# Patient Record
Sex: Female | Born: 2000 | Hispanic: No | Marital: Single | Smoking: Never smoker
Health system: Southern US, Community
[De-identification: ages and names within clinical notes are randomized; demographics above are authoritative.]

## PROBLEM LIST (undated history)

## (undated) DIAGNOSIS — Z789 Other specified health status: Secondary | ICD-10-CM

## (undated) HISTORY — DX: Other specified health status: Z78.9

---
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
## 2019-01-06 ENCOUNTER — Ambulatory Visit: Payer: Self-pay | Admitting: *Deleted

## 2019-01-06 NOTE — Telephone Encounter (Signed)
Pt reports cough, productive for minimal amounts of clear phlegm. Also reports mild headache. Onset last Tuesday, 12/29/2018. Pt states initially sneezing, runny nose but has subsided. Pt concerned because she had been in [facility], returned yesterday; states symptoms had started prior to trip. States afebrile; denies any wheezing, SOB, no chest tightness. States is taking Nyquil, effective. Home Care advise given. Pt verbalizes understanding. Directed to [employer] for additional information, further concerns.   Reason for Disposition . Cough  Answer Assessment - Initial Assessment Questions 1. ONSET: "When did the cough begin?"      Last Tuesday 2. SEVERITY: "How bad is the cough today?"      mild 3. RESPIRATORY DISTRESS: "Describe your breathing."      No SOB 4. FEVER: "Do you have a fever?" If so, ask: "What is your temperature, how was it measured, and when did it start?"     no 5. SPUTUM: "Describe the color of your sputum" (clear, white, yellow, green)     Clear 6. HEMOPTYSIS: "Are you coughing up any blood?" If so ask: "How much?" (flecks, streaks, tablespoons, etc.)     no 7. CARDIAC HISTORY: "Do you have any history of heart disease?" (e.g., heart attack, congestive heart failure)      no 8. LUNG HISTORY: "Do you have any history of lung disease?"  (e.g., pulmonary embolus, asthma, emphysema)     no 9. PE RISK FACTORS: "Do you have a history of blood clots?" (or: recent major surgery, recent prolonged travel, bedridden)     no 10. OTHER SYMPTOMS: "Do you have any other symptoms?" (e.g., runny nose, wheezing, chest pain)       no 11. PREGNANCY: "Is there any chance you are pregnant?" "When was your last menstrual period?"       no 12. TRAVEL: "Have you traveled out of the country in the last month?" (e.g., travel history, exposures)       no  Protocols used: COUGH - ACUTE PRODUCTIVE-A-AH

## 2019-08-04 ENCOUNTER — Encounter: Payer: Self-pay | Admitting: *Deleted

## 2019-08-25 ENCOUNTER — Other Ambulatory Visit: Payer: Self-pay | Admitting: Family Medicine

## 2019-08-25 DIAGNOSIS — N63 Unspecified lump in unspecified breast: Secondary | ICD-10-CM

## 2019-08-31 ENCOUNTER — Other Ambulatory Visit: Payer: Self-pay | Admitting: Emergency Medicine

## 2019-08-31 ENCOUNTER — Telehealth: Payer: Self-pay | Admitting: *Deleted

## 2019-08-31 DIAGNOSIS — N63 Unspecified lump in unspecified breast: Secondary | ICD-10-CM

## 2019-08-31 NOTE — Telephone Encounter (Signed)
Faxed signed URGENT request for mammogram from [facility]. Confirmation page 4:36 pm.

## 2019-09-02 ENCOUNTER — Ambulatory Visit
Admission: RE | Admit: 2019-09-02 | Discharge: 2019-09-02 | Disposition: A | Discharge status: Home / Self Care | Payer: 59 | Source: Ambulatory Visit | Attending: Family Medicine | Admitting: Family Medicine

## 2019-09-02 ENCOUNTER — Other Ambulatory Visit: Payer: Self-pay

## 2019-09-02 ENCOUNTER — Other Ambulatory Visit: Payer: Self-pay | Admitting: Emergency Medicine

## 2019-09-02 DIAGNOSIS — N63 Unspecified lump in unspecified breast: Secondary | ICD-10-CM

## 2019-11-02 ENCOUNTER — Ambulatory Visit: Payer: Self-pay | Attending: Internal Medicine

## 2019-11-11 DIAGNOSIS — Z20822 Contact with and (suspected) exposure to covid-19: Secondary | ICD-10-CM | POA: Diagnosis not present

## 2019-11-27 DIAGNOSIS — Z20828 Contact with and (suspected) exposure to other viral communicable diseases: Secondary | ICD-10-CM | POA: Diagnosis not present

## 2019-11-27 DIAGNOSIS — R05 Cough: Secondary | ICD-10-CM | POA: Diagnosis not present

## 2019-11-27 DIAGNOSIS — R0989 Other specified symptoms and signs involving the circulatory and respiratory systems: Secondary | ICD-10-CM | POA: Diagnosis not present

## 2019-12-07 DIAGNOSIS — Z20822 Contact with and (suspected) exposure to covid-19: Secondary | ICD-10-CM | POA: Diagnosis not present

## 2019-12-07 DIAGNOSIS — R0981 Nasal congestion: Secondary | ICD-10-CM | POA: Diagnosis not present

## 2019-12-20 DIAGNOSIS — Z20828 Contact with and (suspected) exposure to other viral communicable diseases: Secondary | ICD-10-CM | POA: Diagnosis not present

## 2019-12-26 DIAGNOSIS — Z20822 Contact with and (suspected) exposure to covid-19: Secondary | ICD-10-CM | POA: Diagnosis not present

## 2019-12-26 DIAGNOSIS — R509 Fever, unspecified: Secondary | ICD-10-CM | POA: Diagnosis not present

## 2019-12-26 DIAGNOSIS — Z20828 Contact with and (suspected) exposure to other viral communicable diseases: Secondary | ICD-10-CM | POA: Diagnosis not present

## 2020-01-02 DIAGNOSIS — Z20828 Contact with and (suspected) exposure to other viral communicable diseases: Secondary | ICD-10-CM | POA: Diagnosis not present

## 2020-01-12 DIAGNOSIS — Z20828 Contact with and (suspected) exposure to other viral communicable diseases: Secondary | ICD-10-CM | POA: Diagnosis not present

## 2020-01-17 DIAGNOSIS — Z20828 Contact with and (suspected) exposure to other viral communicable diseases: Secondary | ICD-10-CM | POA: Diagnosis not present

## 2020-01-18 DIAGNOSIS — H1045 Other chronic allergic conjunctivitis: Secondary | ICD-10-CM | POA: Diagnosis not present

## 2020-03-02 ENCOUNTER — Other Ambulatory Visit: Payer: 59

## 2020-03-10 ENCOUNTER — Inpatient Hospital Stay: Admission: RE | Admit: 2020-03-10 | Payer: 59 | Source: Ambulatory Visit

## 2020-04-08 DIAGNOSIS — Z20822 Contact with and (suspected) exposure to covid-19: Secondary | ICD-10-CM | POA: Diagnosis not present

## 2020-09-13 ENCOUNTER — Ambulatory Visit: Payer: 59 | Admitting: Registered Nurse

## 2020-09-13 DIAGNOSIS — Z23 Encounter for immunization: Secondary | ICD-10-CM | POA: Diagnosis not present

## 2020-09-13 DIAGNOSIS — E559 Vitamin D deficiency, unspecified: Secondary | ICD-10-CM | POA: Diagnosis not present

## 2020-09-13 DIAGNOSIS — Z0000 Encounter for general adult medical examination without abnormal findings: Secondary | ICD-10-CM | POA: Diagnosis not present

## 2020-09-13 DIAGNOSIS — L309 Dermatitis, unspecified: Secondary | ICD-10-CM | POA: Diagnosis not present

## 2020-09-13 DIAGNOSIS — Z13220 Encounter for screening for lipoid disorders: Secondary | ICD-10-CM | POA: Diagnosis not present

## 2021-04-09 ENCOUNTER — Ambulatory Visit: Payer: Self-pay | Admitting: Physician Assistant

## 2021-04-18 ENCOUNTER — Ambulatory Visit: Payer: Self-pay | Admitting: Physician Assistant

## 2021-06-05 ENCOUNTER — Other Ambulatory Visit: Payer: Self-pay | Admitting: Internal Medicine

## 2021-06-05 ENCOUNTER — Ambulatory Visit
Admission: RE | Admit: 2021-06-05 | Discharge: 2021-06-05 | Disposition: A | Discharge status: Home / Self Care | Payer: BC Managed Care – PPO | Source: Ambulatory Visit | Attending: Internal Medicine | Admitting: Internal Medicine

## 2021-06-05 ENCOUNTER — Other Ambulatory Visit: Payer: Self-pay

## 2021-06-05 DIAGNOSIS — M25572 Pain in left ankle and joints of left foot: Secondary | ICD-10-CM

## 2021-06-05 DIAGNOSIS — M79606 Pain in leg, unspecified: Secondary | ICD-10-CM | POA: Diagnosis not present

## 2021-06-05 DIAGNOSIS — M25472 Effusion, left ankle: Secondary | ICD-10-CM | POA: Diagnosis not present

## 2021-06-07 ENCOUNTER — Other Ambulatory Visit: Payer: Self-pay | Admitting: Internal Medicine

## 2021-06-07 ENCOUNTER — Other Ambulatory Visit: Payer: Self-pay

## 2021-06-07 ENCOUNTER — Ambulatory Visit
Admission: RE | Admit: 2021-06-07 | Discharge: 2021-06-07 | Disposition: A | Discharge status: Home / Self Care | Payer: BC Managed Care – PPO | Source: Ambulatory Visit | Attending: Internal Medicine | Admitting: Internal Medicine

## 2021-06-07 DIAGNOSIS — M79606 Pain in leg, unspecified: Secondary | ICD-10-CM

## 2021-06-07 DIAGNOSIS — R600 Localized edema: Secondary | ICD-10-CM | POA: Diagnosis not present

## 2021-06-07 DIAGNOSIS — M79605 Pain in left leg: Secondary | ICD-10-CM | POA: Diagnosis not present

## 2021-07-19 ENCOUNTER — Ambulatory Visit: Payer: Self-pay | Admitting: Physician Assistant

## 2022-01-26 ENCOUNTER — Emergency Department (HOSPITAL_COMMUNITY)
Admission: EM | Admit: 2022-01-26 | Discharge: 2022-01-26 | Disposition: A | Discharge status: Home / Self Care | Payer: 59 | Attending: Emergency Medicine | Admitting: Emergency Medicine

## 2022-01-26 ENCOUNTER — Other Ambulatory Visit: Payer: Self-pay

## 2022-01-26 ENCOUNTER — Encounter (HOSPITAL_COMMUNITY): Payer: Self-pay | Admitting: Emergency Medicine

## 2022-01-26 DIAGNOSIS — H5789 Other specified disorders of eye and adnexa: Secondary | ICD-10-CM | POA: Diagnosis present

## 2022-01-26 DIAGNOSIS — H1013 Acute atopic conjunctivitis, bilateral: Secondary | ICD-10-CM

## 2022-01-26 DIAGNOSIS — H1033 Unspecified acute conjunctivitis, bilateral: Secondary | ICD-10-CM | POA: Diagnosis not present

## 2022-01-26 MED ORDER — TRIAMCINOLONE ACETONIDE 0.1 % EX CREA: 1.0000 "application " | TOPICAL_CREAM | Freq: Two times a day (BID) | CUTANEOUS | 0 refills | Status: AC

## 2022-01-26 MED ORDER — TRIAMCINOLONE ACETONIDE 0.1 % EX CREA: TOPICAL_CREAM | Freq: Three times a day (TID) | CUTANEOUS | Status: DC | PRN

## 2022-01-26 NOTE — Discharge Instructions (Addendum)
There is information on allergic conjunctivitis attached to these papers.  I have also refilled your cream however I do not believe the symptoms you have right now are due to cellulitis. ? ?Start a medication such as Zyrtec, Allegra or Claritin and take this every day.  You hopefully we will see an improvement in any allergic symptoms.  Continue to use eyedrops and in the event that you lose vision, cannot move your eyes or have any worsening symptoms, return to the emergency department. ? ?You may get in contact and call your [facility] primary care office if needed for nonemergent checkup. ?

## 2022-01-26 NOTE — ED Provider Notes (Signed)
?[facility] EMERGENCY DEPARTMENT ?Provider Note ? ? ?CSN: [id number] ?Arrival date & time: 01/26/22  1106 ? ?  ? ?History ? ?Chief Complaint  ?Patient presents with  ? Burning Eyes  ? ? ?Becky Patel is a 21 y.o. female presenting today with bilateral eye irritation.  Says that this began yesterday as watering and burning in her eyes.  Said that last night there was also some watery discharge.  This morning she had similar symptoms and felt as though her vision was somewhat decreased.  Reports a recent URI, however other symptoms stopped last week.  Says that she usually does not have problems with pollen or seasonal allergies.  Reports a history of cellulitis around her eyes for which she uses triamcinolone cream provided by her PCP.  She reports that she has been using over-the-counter eyedrops which have been helping her eyes feel better.  Denies new face washes, make-up, recent eyelash installation, or other changes. ? ?HPI ? ?  ? ?Home Medications ?Prior to Admission medications   ?Medication Sig Start Date End Date Taking? Authorizing Provider  ?triamcinolone cream (KENALOG) 0.1 % Apply 1 application. topically 2 (two) times daily. 01/26/22  Yes Dariana Garbett A, PA-C  ?nystatin (MYCOSTATIN/NYSTOP) powder Apply topically 4 (four) times daily. ?Patient not taking: Reported on 11/13/2018 11/25/17   Sherren Mocha, MD  ?   ? ?Allergies    ?Patient has no known allergies.   ? ?Review of Systems   ?Review of Systems ?See HPI ?Physical Exam ?Updated Vital Signs ?BP 114/66   Pulse 75   Temp 98.6 ?F (37 ?C)   Resp 18   SpO2 100%  ?Physical Exam ?Vitals and nursing note reviewed.  ?Constitutional:   ?   Appearance: Normal appearance.  ?HENT:  ?   Head: Normocephalic and atraumatic.  ?Eyes:  ?   General: No scleral icterus. ?   Extraocular Movements: Extraocular movements intact.  ?   Pupils: Pupils are equal, round, and reactive to light.  ?   Comments: Bilateral watery eyes, erythematous eyelids.  No  discharge, crusting.  Some cobblestoning in the anterior upper eyelids bilaterally.  No proptosis, pain with EOMs, blepharitis or obvious cellulitis  ?Pulmonary:  ?   Effort: Pulmonary effort is normal. No respiratory distress.  ?Skin: ?   Findings: No rash.  ?Neurological:  ?   Mental Status: She is alert.  ?Psychiatric:     ?   Mood and Affect: Mood normal.  ? ? ?ED Results / Procedures / Treatments   ?Labs ?(all labs ordered are listed, but only abnormal results are displayed) ?Labs Reviewed - No data to display ? ?EKG ?None ? ?Radiology ?No results found. ? ?Procedures ?Procedures  ? ?Medications Ordered in ED ?Medications - No data to display ? ?ED Course/ Medical Decision Making/ A&P ?  ?                        ?Medical Decision Making ?Risk ?Prescription drug management. ? ? ?21 year old female presented with burning to her eyes.  Differential includes but is not limited to allergic conjunctivitis, viral conjunctivitis, bacterial conjunctivitis, orbital or preseptal cellulitis. ? ?Physical exam consistent with an allergic conjunctivitis.  Potentially has some viral component in the context of her recent URI.  Regardless, no antibiotics are indicated at this time.  She will use over-the-counter drops that have already been helping her and start taking an antihistamine.  Return precautions were discussed.  She  will follow-up with her primary care provider for any further concerns.  She is requesting a refill of her triamcinolone cream that she uses on her face because she accidentally left it somewhere out of town.  This has been refilled and she has been instructed to follow-up with [facility] PCP. ? ? ?Final Clinical Impression(s) / ED Diagnoses ?Final diagnoses:  ?Allergic conjunctivitis of both eyes  ? ? ?Rx / DC Orders ?ED Discharge Orders   ? ?      Ordered  ?  triamcinolone cream (KENALOG) 0.1 %  2 times daily       ? 01/26/22 1121  ? ?  ?  ? ?  ? ? ?  ?Saddie Benders, PA-C ?01/26/22 1131 ? ?  ?Gerhard Munch, MD ?01/26/22 1345 ? ?

## 2022-01-26 NOTE — ED Triage Notes (Signed)
C/o bilateral eyes red and burning since yesterday.   ?

## 2022-11-28 IMAGING — US US EXTREM LOW VENOUS*L*
1 series · 13 of 24 positions shown · non-contrast
Comparison: None.

CLINICAL DATA: Left lower extremity pain and edema for 2 weeks.



[Series 1: us extrem low venous*left* · 0.08mm/px · 13 of 42 slices shown]
[im 1/42]
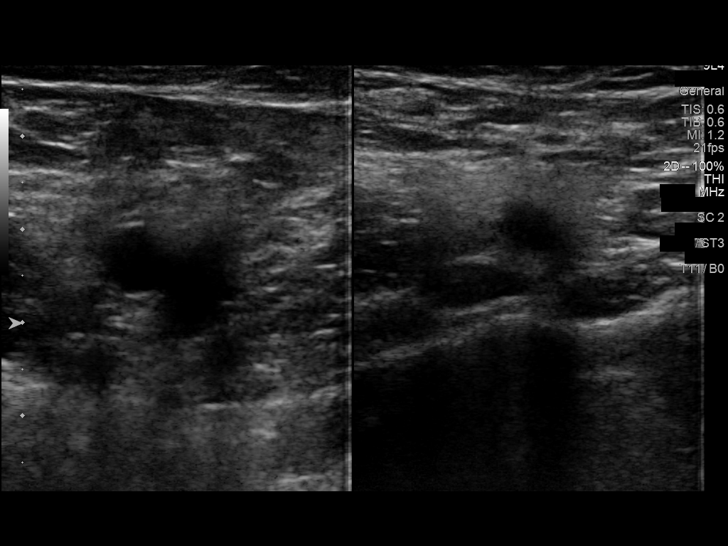
[im 4/42]
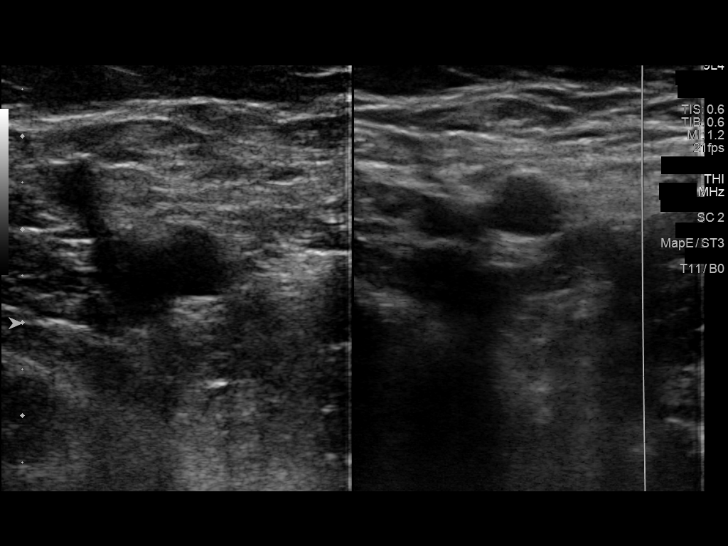
[im 8/42]
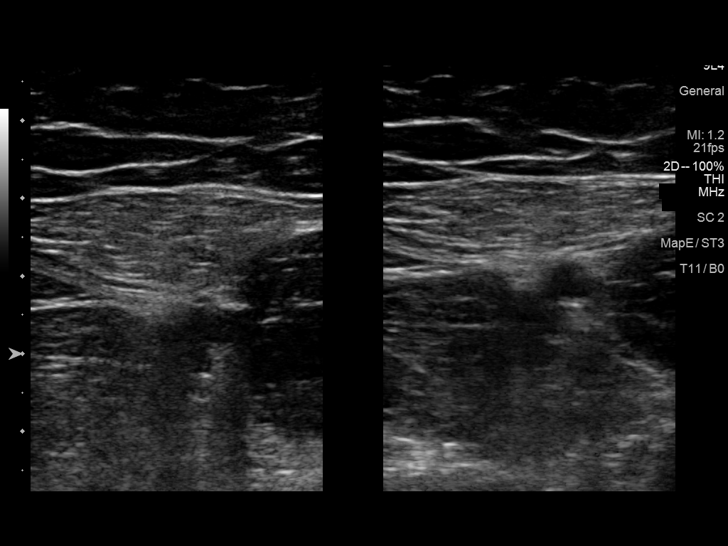
[im 11/42]
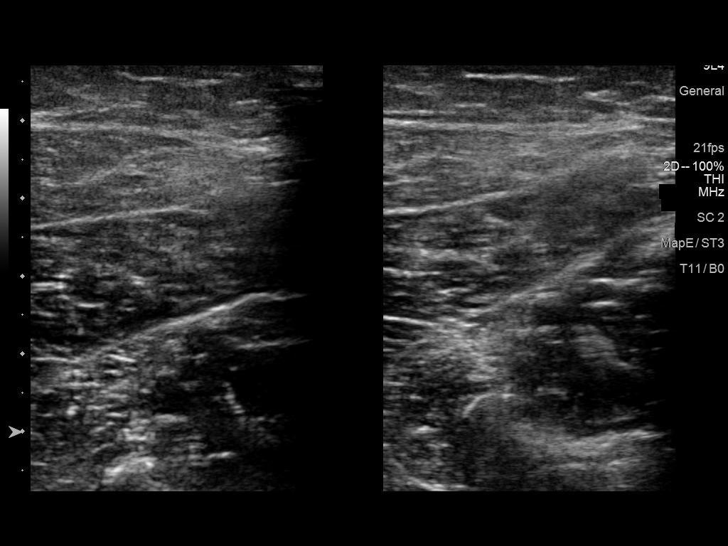
[im 15/42]
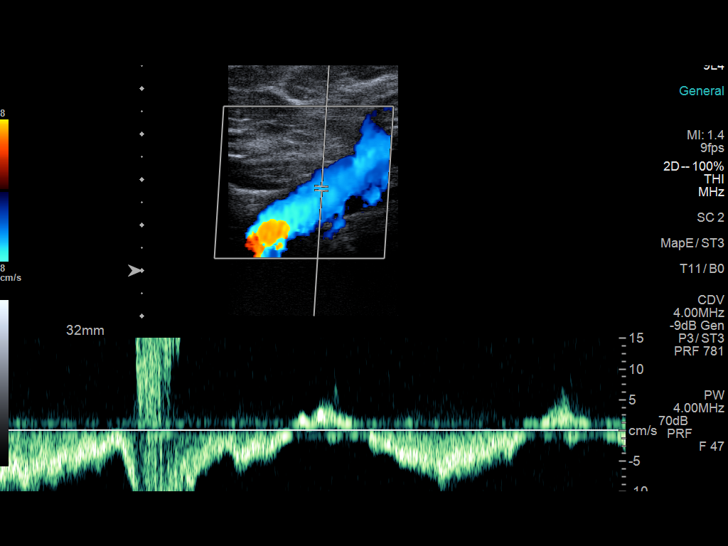
[im 18/42]
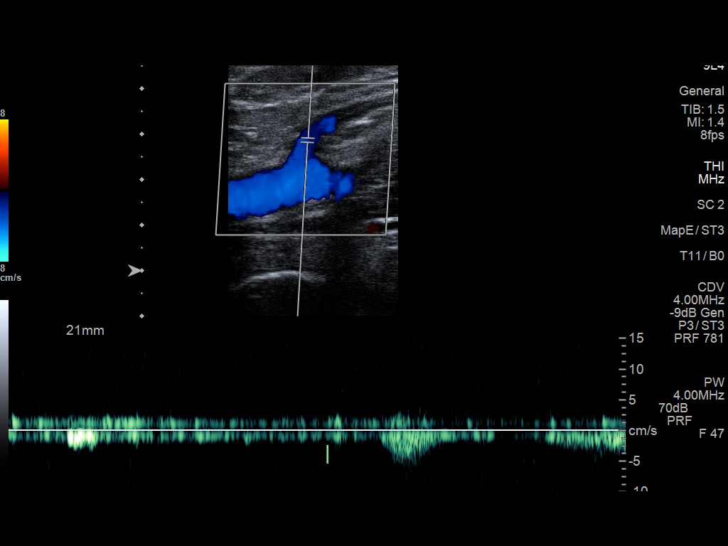
[im 22/42]
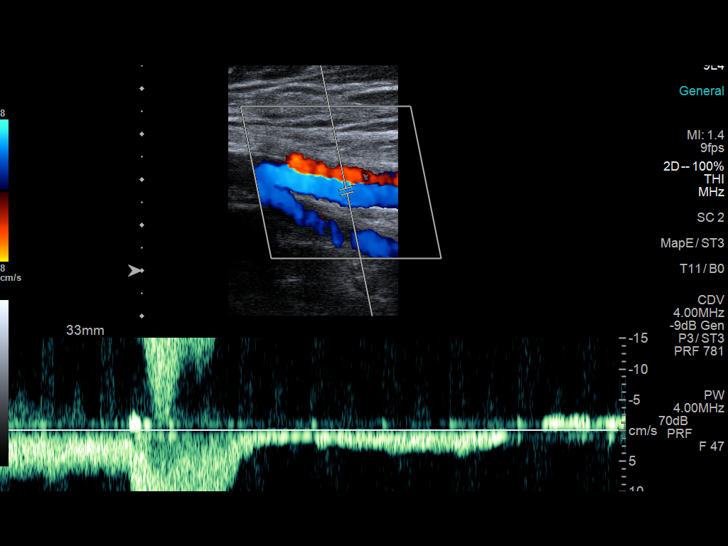
[im 24/42]
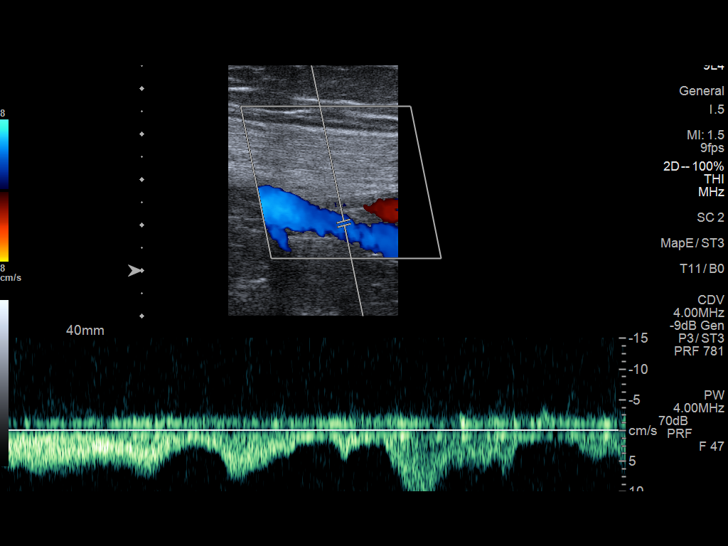
[im 27/42]
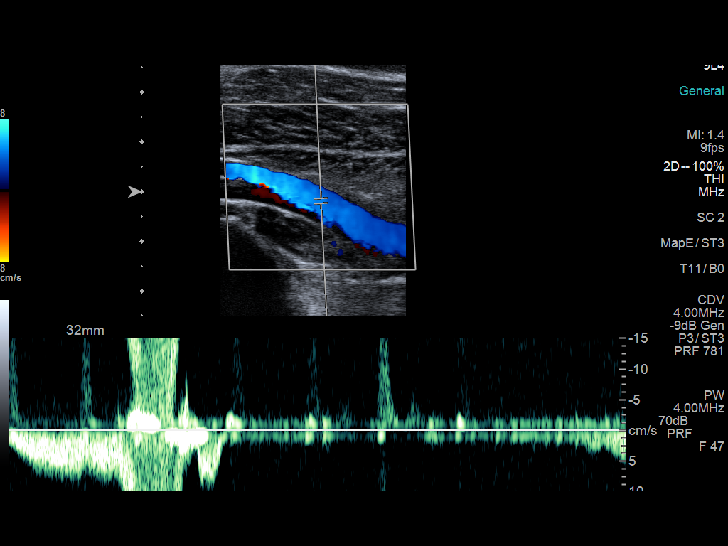
[im 31/42]
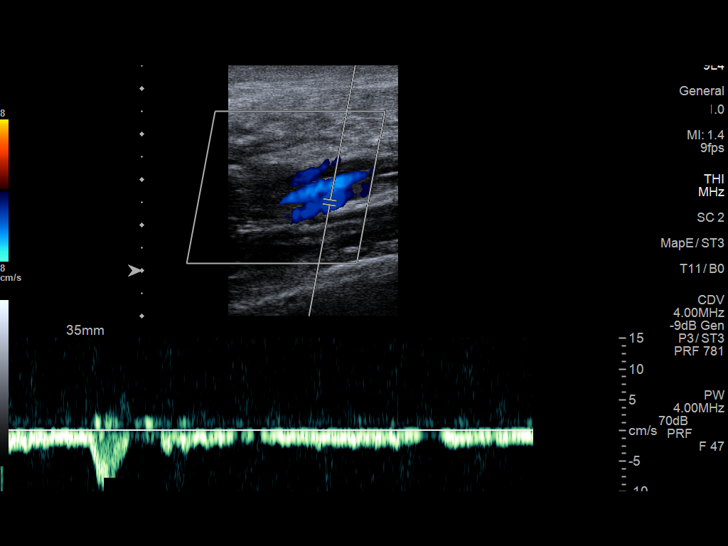
[im 34/42]
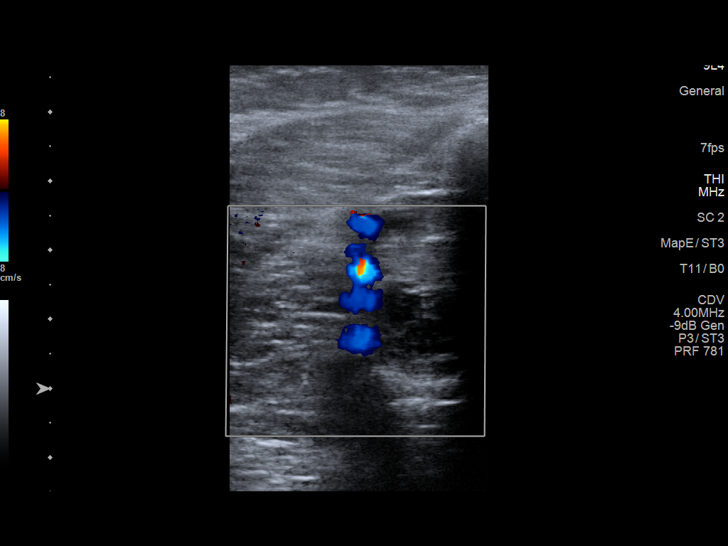
[im 38/42]
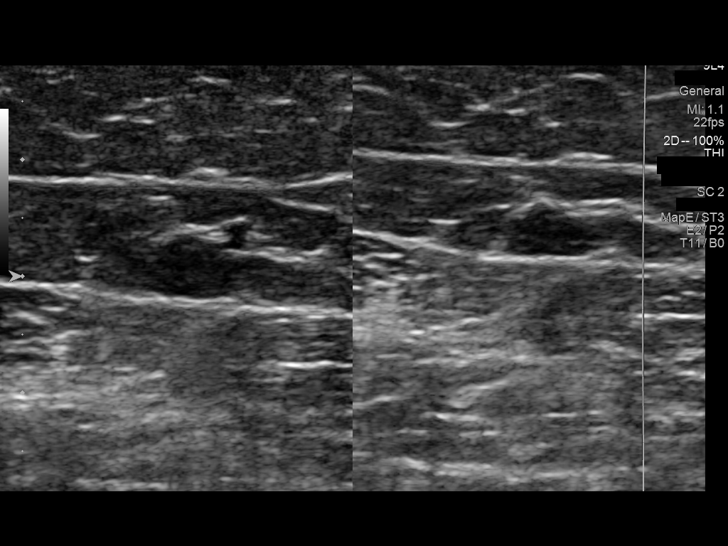
[im 42/42]
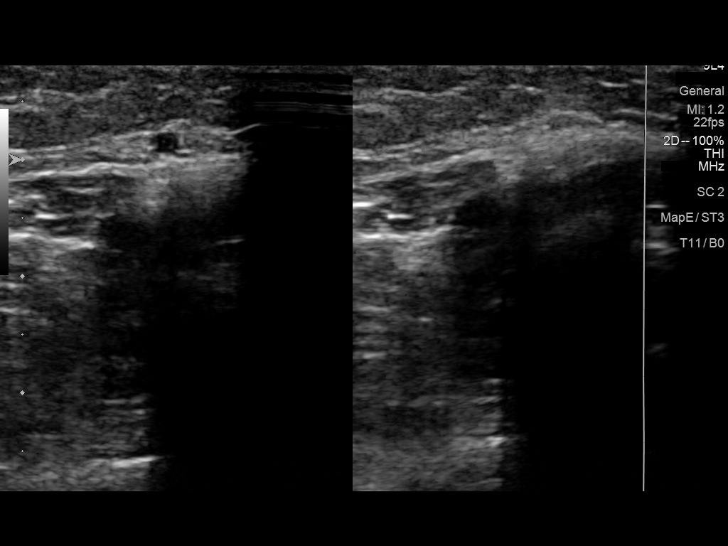

[13 of 24 positions shown; findings below may reference images not displayed]

FINDINGS: Contralateral Common Femoral Vein: Respiratory phasicity is normal
and symmetric with the symptomatic side. No evidence of thrombus.
Normal compressibility.

Common Femoral Vein: No evidence of thrombus. Normal
compressibility, respiratory phasicity and response to augmentation.

Saphenofemoral Junction: No evidence of thrombus. Normal
compressibility and flow on color Doppler imaging.

Profunda Femoral Vein: No evidence of thrombus. Normal
compressibility and flow on color Doppler imaging.

Femoral Vein: No evidence of thrombus. Normal compressibility,
respiratory phasicity and response to augmentation.

Popliteal Vein: No evidence of thrombus. Normal compressibility,
respiratory phasicity and response to augmentation.

Calf Veins: No evidence of thrombus. Normal compressibility and flow
on color Doppler imaging.

Superficial Great Saphenous Vein: No evidence of thrombus. Normal
compressibility.

Venous Reflux:  None.

Other Findings: No evidence of superficial thrombophlebitis or
abnormal fluid collection.
IMPRESSION: No evidence of left lower extremity deep venous thrombosis.

## 2023-10-03 ENCOUNTER — Encounter (HOSPITAL_COMMUNITY): Payer: Self-pay | Admitting: Emergency Medicine
